# Patient Record
Sex: Male | Born: 1955 | Race: White | Hispanic: No | Marital: Married | Smoking: Never smoker
Health system: Southern US, Community
[De-identification: ages and names within clinical notes are randomized; demographics above are authoritative.]

## PROBLEM LIST (undated history)

## (undated) DIAGNOSIS — K219 Gastro-esophageal reflux disease without esophagitis: Secondary | ICD-10-CM

## (undated) DIAGNOSIS — F419 Anxiety disorder, unspecified: Secondary | ICD-10-CM

## (undated) HISTORY — PX: COLON RESECTION: SHX5231

---

## 2017-07-21 ENCOUNTER — Encounter: Payer: Self-pay | Admitting: Emergency Medicine

## 2017-07-21 ENCOUNTER — Emergency Department (INDEPENDENT_AMBULATORY_CARE_PROVIDER_SITE_OTHER): Payer: BLUE CROSS/BLUE SHIELD

## 2017-07-21 ENCOUNTER — Emergency Department (INDEPENDENT_AMBULATORY_CARE_PROVIDER_SITE_OTHER)
Admission: EM | Admit: 2017-07-21 | Discharge: 2017-07-21 | Disposition: A | Discharge status: Home / Self Care | Payer: BLUE CROSS/BLUE SHIELD | Source: Home / Self Care | Attending: Family Medicine | Admitting: Family Medicine

## 2017-07-21 ENCOUNTER — Other Ambulatory Visit: Payer: Self-pay

## 2017-07-21 DIAGNOSIS — R05 Cough: Secondary | ICD-10-CM

## 2017-07-21 DIAGNOSIS — J3089 Other allergic rhinitis: Secondary | ICD-10-CM

## 2017-07-21 DIAGNOSIS — R0602 Shortness of breath: Secondary | ICD-10-CM | POA: Diagnosis not present

## 2017-07-21 DIAGNOSIS — R053 Chronic cough: Secondary | ICD-10-CM

## 2017-07-21 HISTORY — DX: Gastro-esophageal reflux disease without esophagitis: K21.9

## 2017-07-21 HISTORY — DX: Anxiety disorder, unspecified: F41.9

## 2017-07-21 MED ORDER — DOXYCYCLINE HYCLATE 100 MG PO CAPS: 100.0000 mg | ORAL_CAPSULE | Freq: Two times a day (BID) | ORAL | 0 refills | Status: AC

## 2017-07-21 MED ORDER — METHYLPREDNISOLONE ACETATE 80 MG/ML IJ SUSP
80.0000 mg | Freq: Once | INTRAMUSCULAR | Status: AC
  Administered 2017-07-21: 80 mg via INTRAMUSCULAR

## 2017-07-21 MED ORDER — BENZONATATE 200 MG PO CAPS: ORAL_CAPSULE | ORAL | 0 refills | Status: AC

## 2017-07-21 NOTE — Discharge Instructions (Signed)
Take plain guaifenesin (1200mg extended release tabs such as Mucinex) twice daily, with plenty of water, for cough and congestion.  May add Pseudoephedrine (30mg, one or two every 4 to 6 hours) for sinus congestion.  Get adequate rest.   °May use Afrin nasal spray (or generic oxymetazoline) each morning for about 5 days and then discontinue.  Also recommend using saline nasal spray several times daily and saline nasal irrigation (AYR is a common brand).  Use Flonase nasal spray each morning after using Afrin nasal spray and saline nasal irrigation. °Try warm salt water gargles for sore throat.  °Stop all antihistamines for now, and other non-prescription cough/cold preparations. °May take Delsym Cough Suppressant with Tessalon at bedtime for nighttime cough.  °

## 2017-07-21 NOTE — ED Provider Notes (Signed)
[facility]    CSN: [id number] Arrival date & time: 07/21/17  1201     History   Chief Complaint Chief Complaint  Patient presents with  . Cough    HPI Jared Roach is a 62 y.o. male.   Patient was treated for a viral URI by his PCP 9 days ago.  Five days ago he developed flu-like symptoms and was prescribed a course of Tamiflu.  He now complains of persistent non-productive cough, occasional shortness of breath, and tightness in his anterior chest.  He is concerned that he may have pneumonia (has history of pneumonia in 2018).  His last fever was 3 days ago.  He also notes development of tenderness in his right abdomen at site of previous surgery, exacerbated by coughing. He has a history of seasonal rhinitis, and recurring sinusitis.   The history is provided by the patient.    Past Medical History:  Diagnosis Date  . Anxiety   . GERD (gastroesophageal reflux disease)     There are no active problems to display for this patient.   Past Surgical History:  Procedure Laterality Date  . COLON RESECTION         Home Medications    Prior to Admission medications   Medication Sig Start Date End Date Taking? Authorizing Provider  FLUoxetine (PROZAC) 10 MG tablet Take 5 mg by mouth daily.   Yes [provider]  lansoprazole (PREVACID) 30 MG capsule Take 30 mg by mouth daily at 12 noon.   Yes [provider]  benzonatate (TESSALON) 200 MG capsule Take one cap by mouth at bedtime as needed for cough.  May repeat in 4 to 6 hours 07/21/17   Lattie Haw, MD  doxycycline (VIBRAMYCIN) 100 MG capsule Take 1 capsule (100 mg total) by mouth 2 (two) times daily. Take with food. 07/21/17   Lattie Haw, MD    Family History History reviewed. No pertinent family history.  Social History Social History   Tobacco Use  . Smoking status: Never Smoker  . Smokeless tobacco: Never Used  Substance Use Topics  . Alcohol use: No    Frequency:  Never  . Drug use: Not on file     Allergies   Patient has no known allergies.   Review of Systems Review of Systems No sore throat + cough No pleuritic pain, but has tightness in his anterior chest No wheezing + nasal congestion + post-nasal drainage + sinus pain/pressure No itchy/red eyes No earache No hemoptysis No SOB No fever/chills + nausea No vomiting + abdominal pain No diarrhea No urinary symptoms No skin rash + fatigue No myalgias No headache Used OTC meds without relief   Physical Exam Triage Vital Signs ED Triage Vitals  Enc Vitals Group     BP 07/21/17 1316 135/89     Pulse Rate 07/21/17 1316 94     Resp 07/21/17 1316 18     Temp 07/21/17 1316 98.2 F (36.8 C)     Temp Source 07/21/17 1316 Oral     SpO2 07/21/17 1316 96 %     Weight 07/21/17 1320 215 lb (97.5 kg)     Height 07/21/17 1320 5\' 6"  (1.676 m)     Head Circumference --      Peak Flow --      Pain Score --      Pain Loc --      Pain Edu? --      Excl. in GC? --  No data found.  Updated Vital Signs BP 135/89 (BP Location: Right Arm)   Pulse 94   Temp 98.2 F (36.8 C) (Oral)   Resp 18   Ht 5\' 6"  (1.676 m)   Wt 215 lb (97.5 kg)   SpO2 96%   BMI 34.70 kg/m   Visual Acuity Right Eye Distance:   Left Eye Distance:   Bilateral Distance:    Right Eye Near:   Left Eye Near:    Bilateral Near:     Physical Exam Nursing notes and Vital Signs reviewed. Appearance:  Patient appears stated age, and in no acute distress Eyes:  Pupils are equal, round, and reactive to light and accomodation.  Extraocular movement is intact.  Conjunctivae are not inflamed  Ears:  Canals normal.  Tympanic membranes normal.  Nose:  Mildly congested turbinates.  No sinus tenderness.  Pharynx:  Normal Neck:  Supple.  Enlarged posterior/lateral nodes are palpated bilaterally, tender to palpation on the left.   Lungs:  Clear to auscultation.  Breath sounds are equal.  Moving air well. Heart:   Regular rate and rhythm without murmurs, rubs, or gallops.  Abdomen:  Mild tenderness at surgical scar right abdomen during valsalva, without masses or hepatosplenomegaly.  Bowel sounds are present.  No CVA or flank tenderness.  Extremities:  No edema.  Skin:  No rash present.    UC Treatments / Results  Labs (all labs ordered are listed, but only abnormal results are displayed) Labs Reviewed - No data to display  EKG  EKG Interpretation None       Radiology Dg Chest 2 View  Result Date: 07/21/2017 CLINICAL DATA:  Cough and shortness of breath EXAM: CHEST - 2 VIEW COMPARISON:  None available FINDINGS: The heart size and mediastinal contours are within normal limits. Both lungs are clear. The visualized skeletal structures are unremarkable. IMPRESSION: No active cardiopulmonary disease. Electronically Signed   By: Judie PetitM.  Shick M.D.   On: 07/21/2017 14:15    Procedures Procedures (including critical care time)  Medications Ordered in UC Medications  methylPREDNISolone acetate (DEPO-MEDROL) injection 80 mg (not administered)     Initial Impression / Assessment and Plan / UC Course  I have reviewed the triage vital signs and the nursing notes.  Pertinent labs & imaging results that were available during my care of the patient were reviewed by me and considered in my medical decision making (see chart for details).    Negative chest X-ray reassuring. Administered Depo Medrol 80mg  IM Begin empiric doxycycline for atypical coverage. Prescription written for Benzonatate [facility](Tessalon) to take at bedtime for night-time cough.  Take plain guaifenesin (1200mg  extended release tabs such as Mucinex) twice daily, with plenty of water, for cough and congestion.  May add Pseudoephedrine (30mg , one or two every 4 to 6 hours) for sinus congestion.  Get adequate rest.   May use Afrin nasal spray (or generic oxymetazoline) each morning for about 5 days and then discontinue.  Also recommend using  saline nasal spray several times daily and saline nasal irrigation (AYR is a common brand).  Use Flonase nasal spray each morning after using Afrin nasal spray and saline nasal irrigation. Try warm salt water gargles for sore throat.  Stop all antihistamines for now, and other non-prescription cough/cold preparations. May take Delsym Cough Suppressant with Tessalon at bedtime for nighttime cough.  Followup with Family Doctor if not improved in 10 days. Also recommend follow-up with family doctor for possible developing incisional hernia right abdomen.  Final Clinical Impressions(s) / UC Diagnoses   Final diagnoses:  Persistent cough for 3 weeks or longer  Perennial allergic rhinitis    ED Discharge Orders        Ordered    doxycycline (VIBRAMYCIN) 100 MG capsule  2 times daily     07/21/17 1444    benzonatate (TESSALON) 200 MG capsule     07/21/17 1444          Lattie Haw, MD 07/22/17 1349

## 2017-07-21 NOTE — ED Triage Notes (Signed)
Patient was treated for suspicion of influenza last week but his cough is lingering despite rx with codeine.Concerned he may have bronchitis or pneumonia.

## 2019-03-01 IMAGING — DX DG CHEST 2V
2 series · 2 of 2 positions shown · non-contrast
Comparison: None available

CLINICAL DATA: Cough and shortness of breath

EXAM:
CHEST - 2 VIEW

[chest pa]
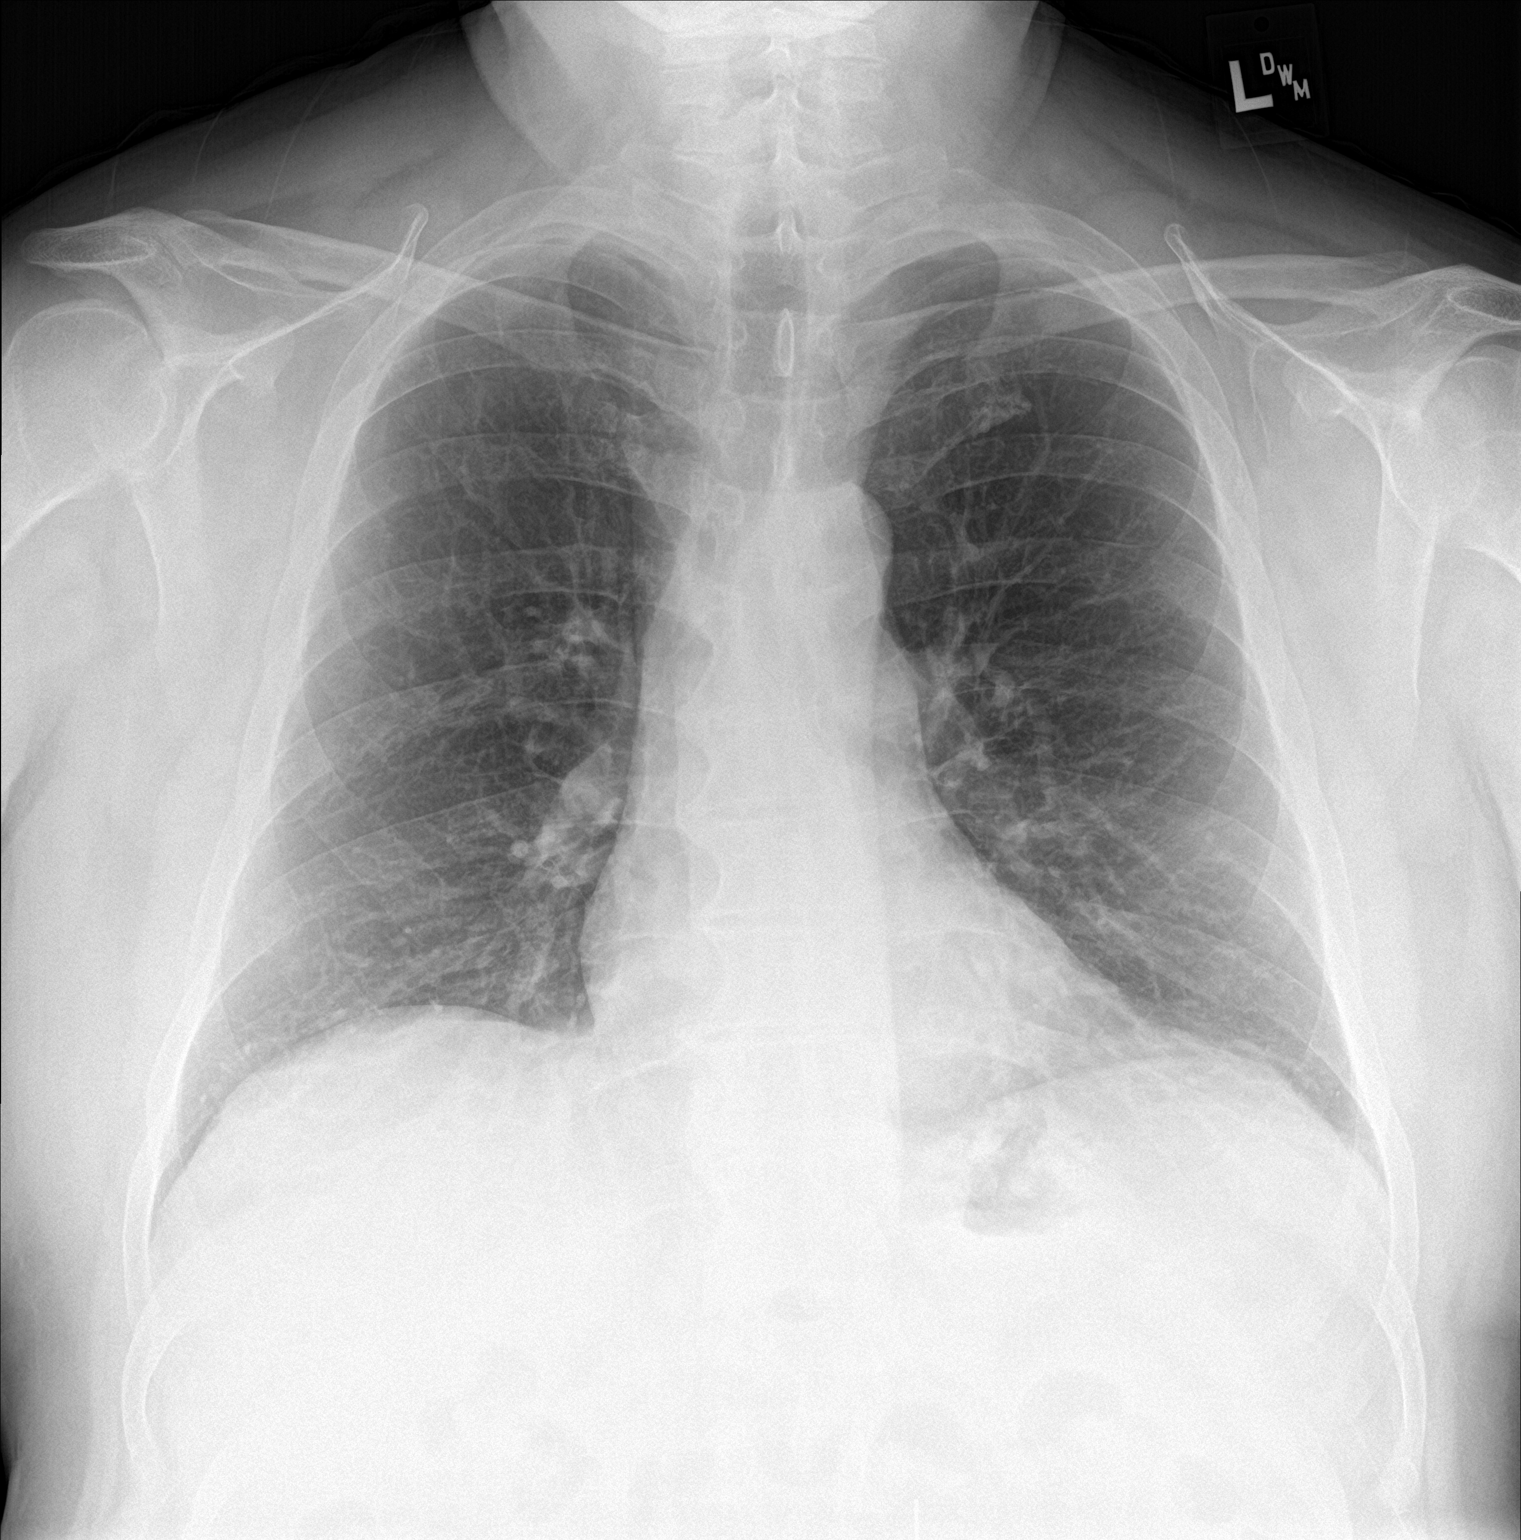

[chest lat]
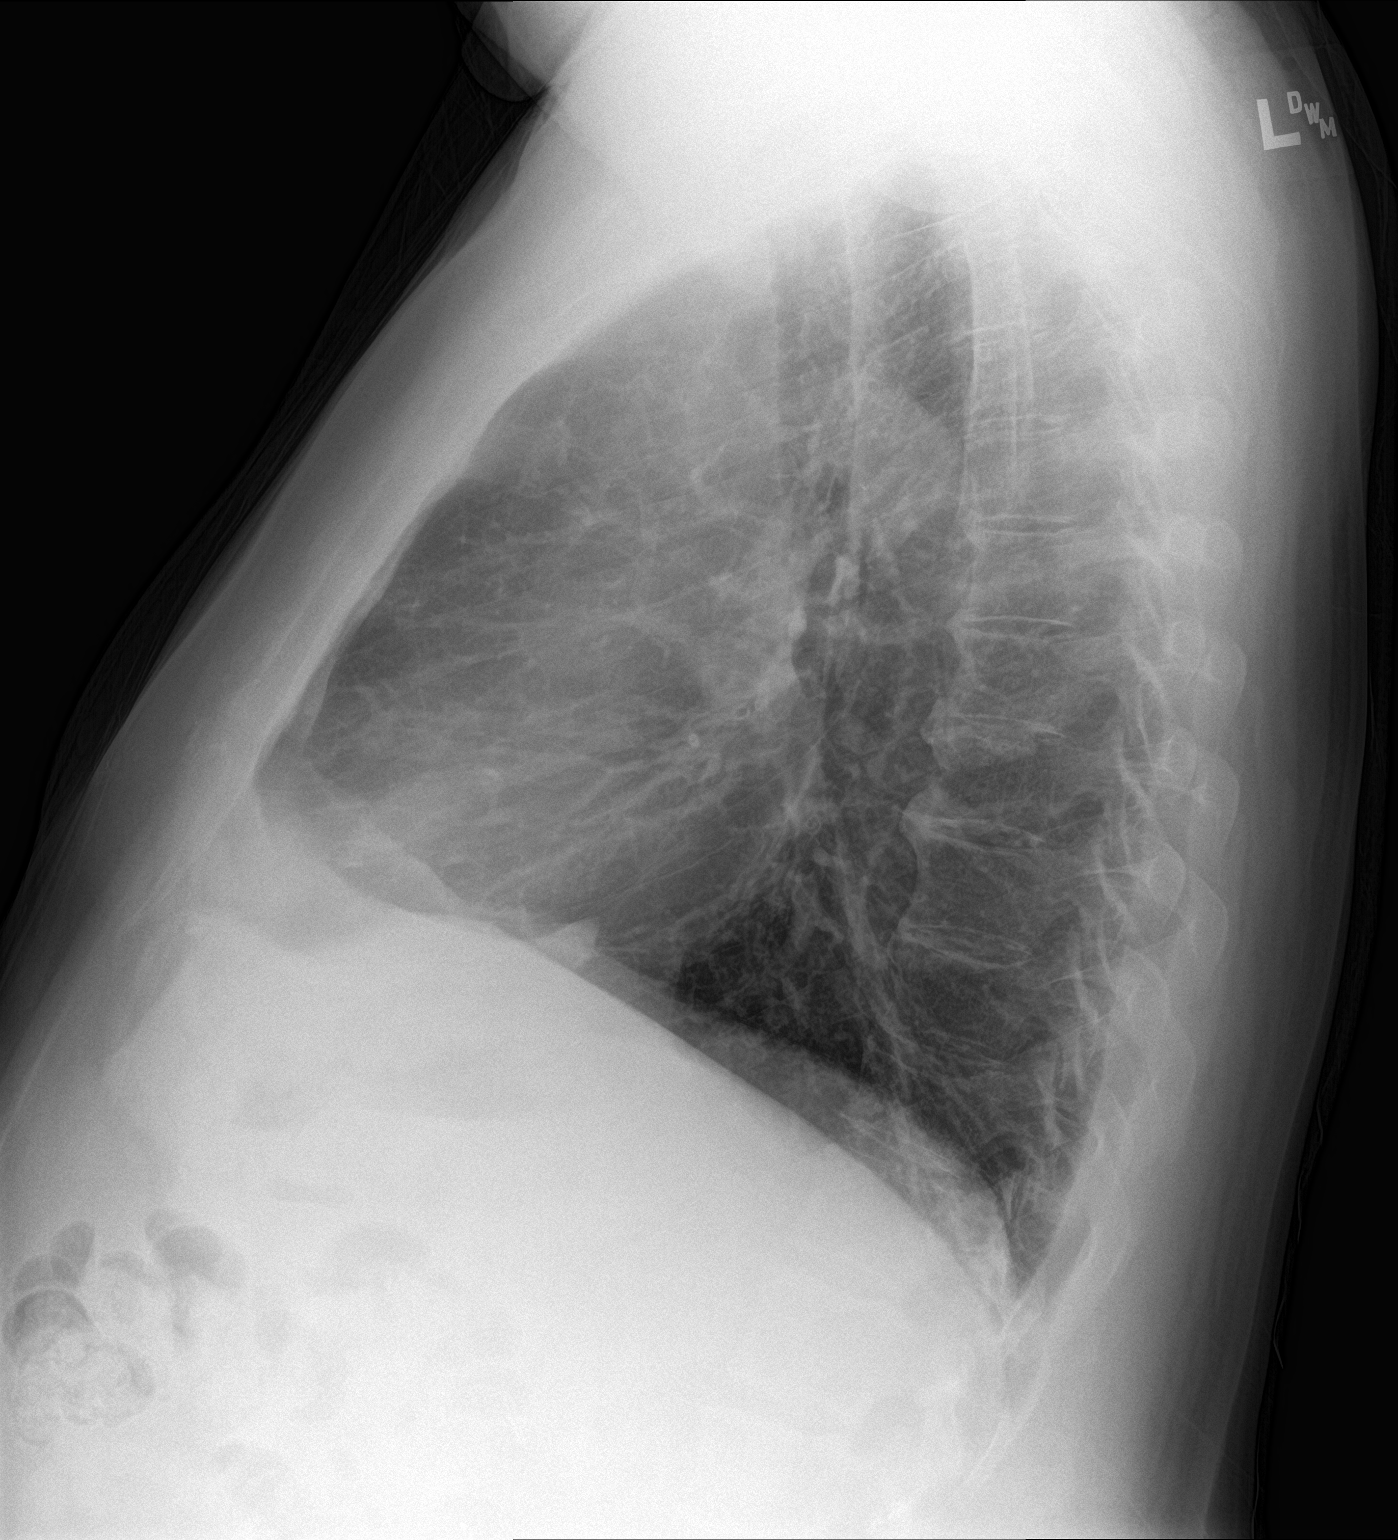

[2 of 2 positions shown; findings below may reference images not displayed]

FINDINGS: The heart size and mediastinal contours are within normal limits.
Both lungs are clear. The visualized skeletal structures are
unremarkable.
IMPRESSION: No active cardiopulmonary disease.
